# Patient Record
Sex: Female | Born: 1935 | Race: Black or African American | Hispanic: No | Smoking: Never smoker
Health system: Southern US, Community
[De-identification: ages and names within clinical notes are randomized; demographics above are authoritative.]

## PROBLEM LIST (undated history)

## (undated) DIAGNOSIS — F0280 Dementia in other diseases classified elsewhere without behavioral disturbance: Secondary | ICD-10-CM

## (undated) DIAGNOSIS — G309 Alzheimer's disease, unspecified: Secondary | ICD-10-CM

## (undated) DIAGNOSIS — I219 Acute myocardial infarction, unspecified: Secondary | ICD-10-CM

## (undated) DIAGNOSIS — D219 Benign neoplasm of connective and other soft tissue, unspecified: Secondary | ICD-10-CM

## (undated) DIAGNOSIS — N200 Calculus of kidney: Secondary | ICD-10-CM

## (undated) HISTORY — PX: HEMORRHOID SURGERY: SHX153

## (undated) HISTORY — PX: RECTAL PROLAPSE EXCISION: SUR549

---

## 2008-09-10 ENCOUNTER — Emergency Department (HOSPITAL_BASED_OUTPATIENT_CLINIC_OR_DEPARTMENT_OTHER): Admission: EM | Admit: 2008-09-10 | Discharge: 2008-09-10 | Payer: Self-pay | Admitting: Emergency Medicine

## 2008-09-10 ENCOUNTER — Ambulatory Visit: Payer: Self-pay | Admitting: Radiology

## 2009-08-05 ENCOUNTER — Ambulatory Visit: Payer: Self-pay | Admitting: Diagnostic Radiology

## 2009-08-05 ENCOUNTER — Emergency Department (HOSPITAL_BASED_OUTPATIENT_CLINIC_OR_DEPARTMENT_OTHER): Admission: EM | Admit: 2009-08-05 | Discharge: 2009-08-05 | Payer: Self-pay | Admitting: Emergency Medicine

## 2009-12-31 IMAGING — CT CT HEAD W/O CM
1 of 3 series · 15 of 30 positions shown, 19 images · non-contrast
Comparison: None available.

CT HEAD

CLINICAL DATA: Status post fall.  Laceration over right eye.
History of dementia.

CT HEAD WITHOUT CONTRAST
CT MAXILLOFACIAL WITHOUT CONTRAST
TECHNIQUE: Multidetector CT imaging of the head and maxillofacial
structures were performed using the standard protocol without
intravenous contrast. Multiplanar CT image reconstructions of the
maxillofacial structures were also generated.

[Series 3: head 4.8 h37s · axial · 0.46mm/px · z∈[-146,-0]mm · 15 of 34 slices shown, 19 images]
[im 2/34  brain]
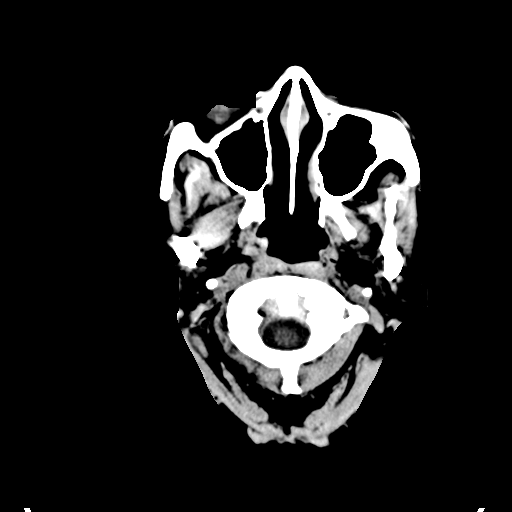
[im 2/34  bone]
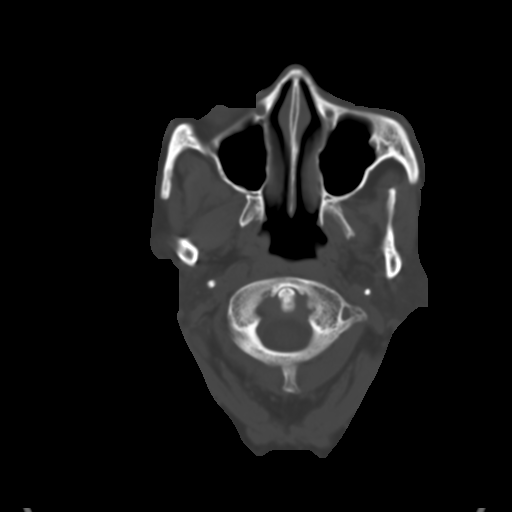
[im 5/34  brain]
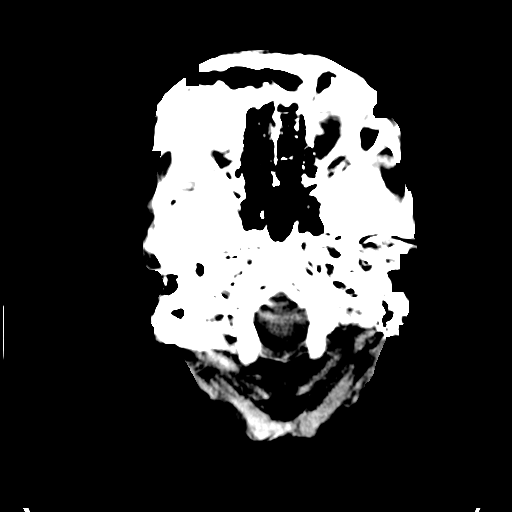
[im 7/34  brain]
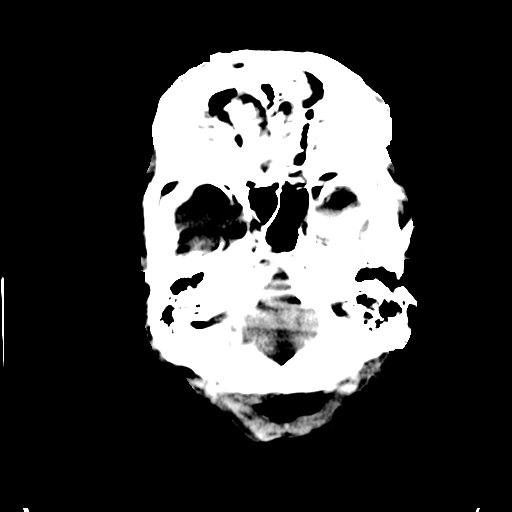
[im 8/34  brain]
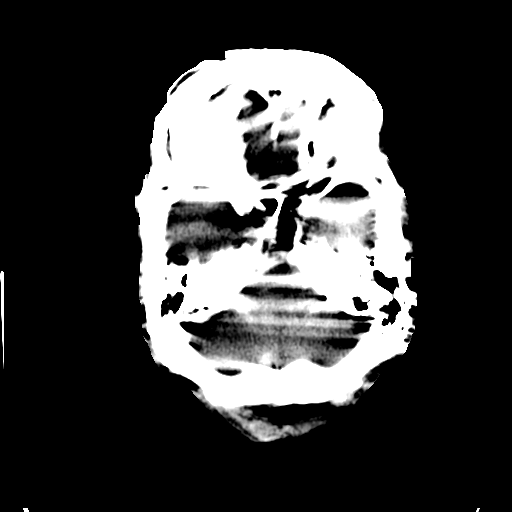
[im 12/34  brain]
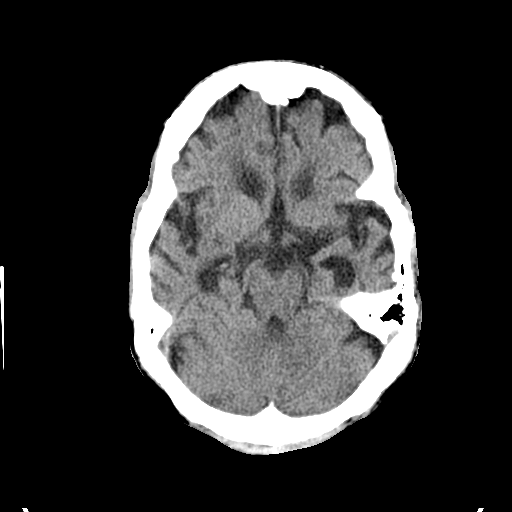
[im 12/34  bone]
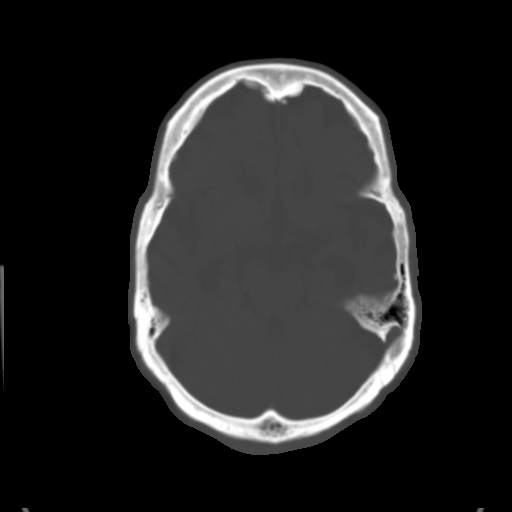
[im 13/34  brain]
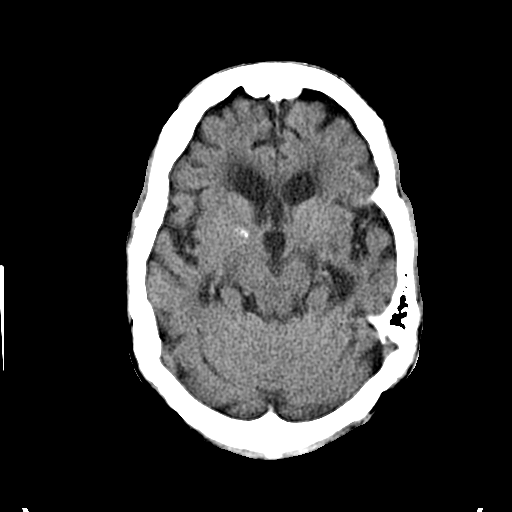
[im 15/34  brain]
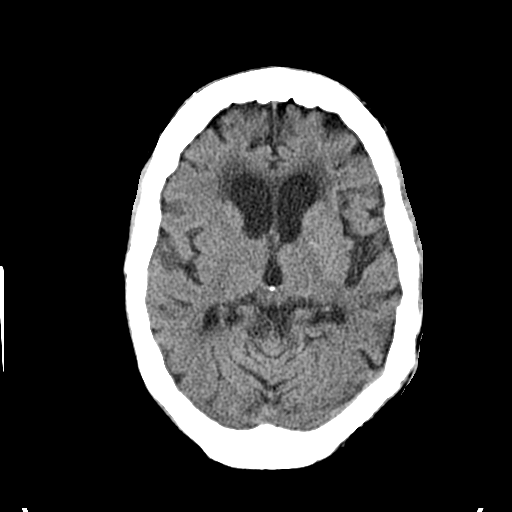
[im 18/34  brain]
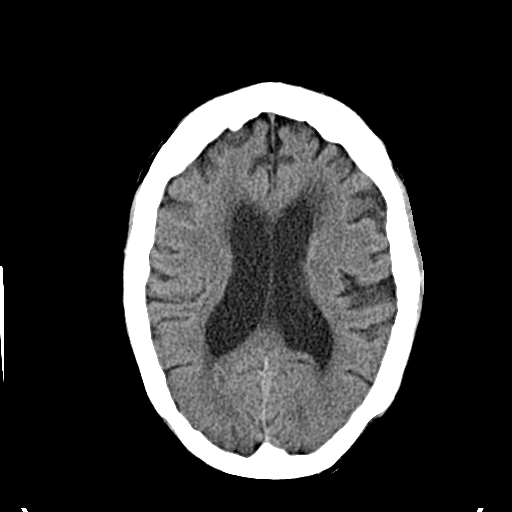
[im 19/34  brain]
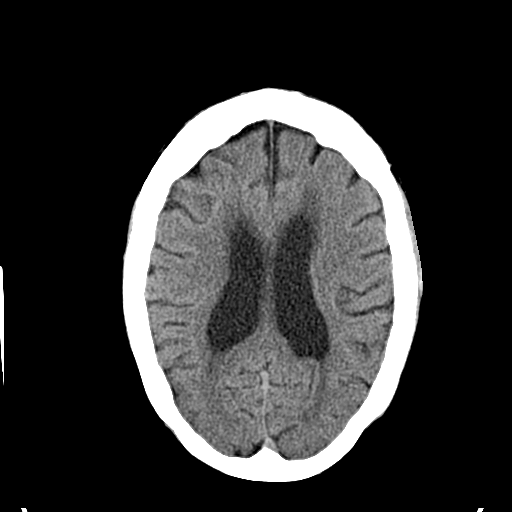
[im 19/34  bone]
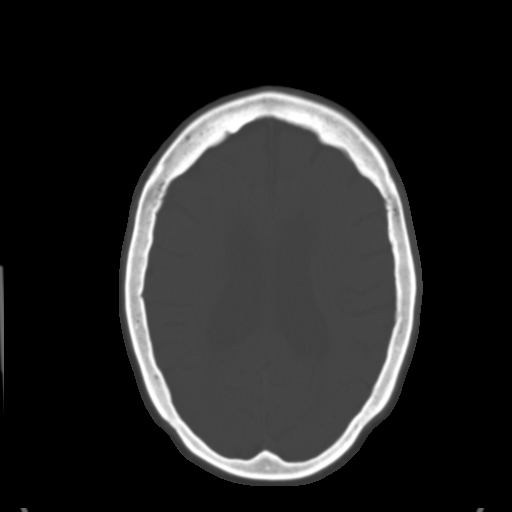
[im 21/34  brain]
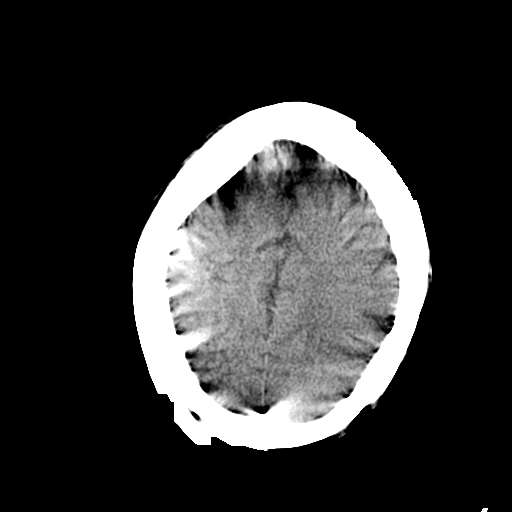
[im 24/34  brain]
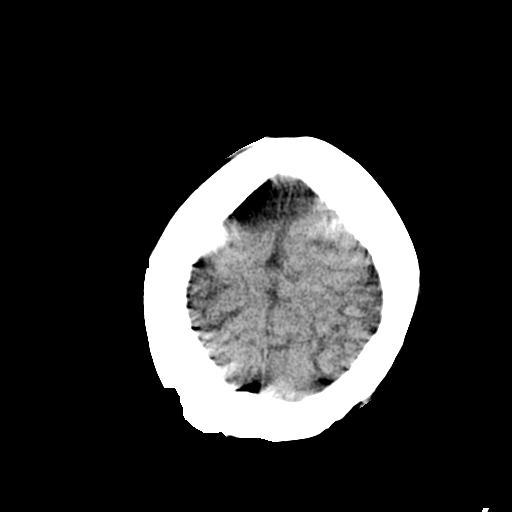
[im 26/34  brain]
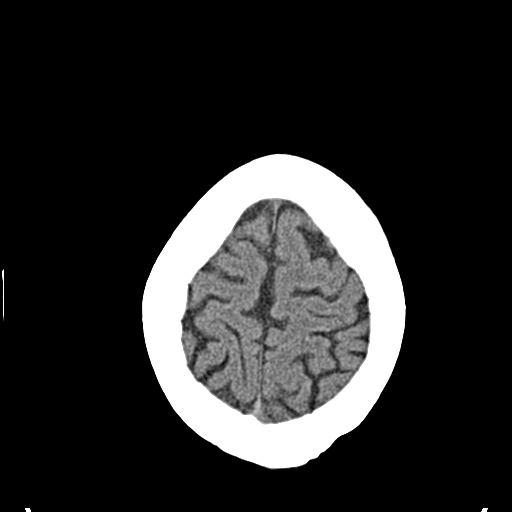
[im 27/34  brain]
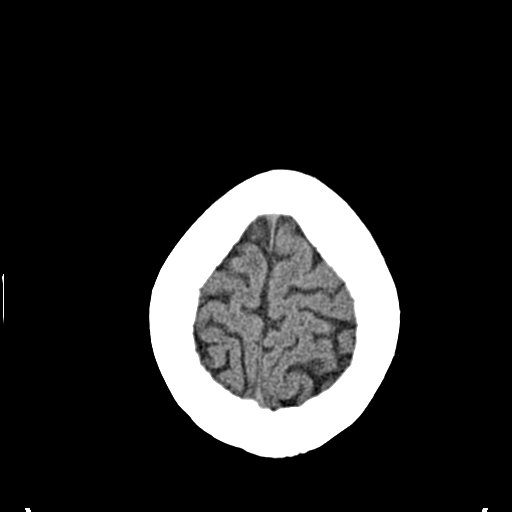
[im 27/34  bone]
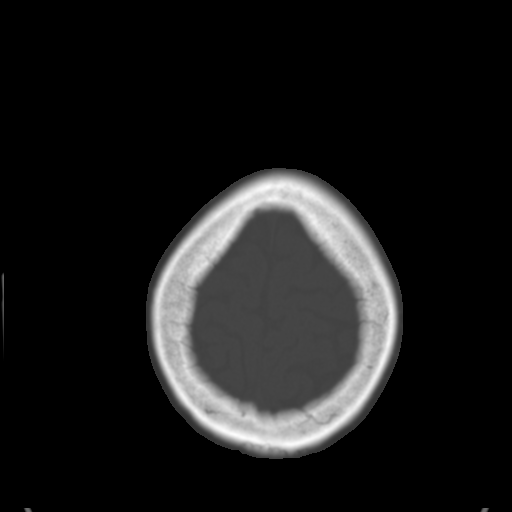
[im 30/34  brain]
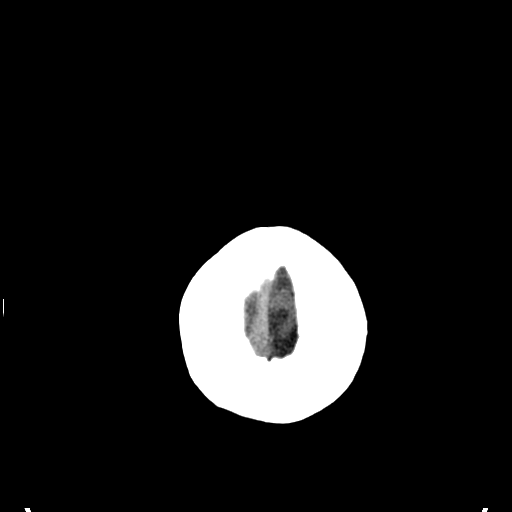
[im 32/34  brain]
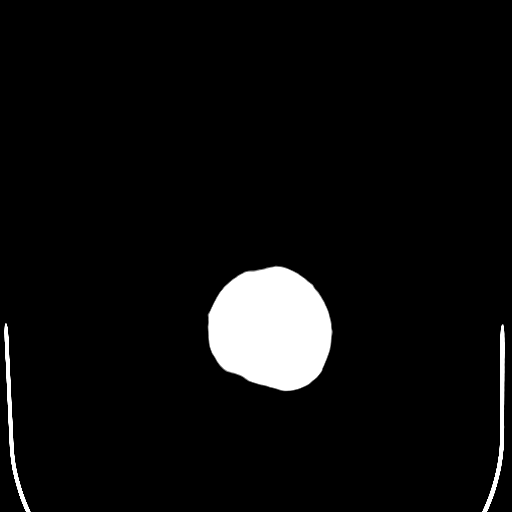

[15 of 30 positions shown; findings below may reference images not displayed]

FINDINGS: The study is somewhat degraded by patient motion.
Multiple areas were imaged a second time for better coverage.  No
acute intracranial abnormality is present.  Specifically, there is
no evidence for acute infarct, hemorrhage, mass, hydrocephalus, or
extra-axial fluid collection.  Mild atrophy is present.
Periventricular white matter hypoattenuation is noted bilaterally.

The paranasal sinuses and mastoid air cells are clear.  The osseous
skull is intact.
IMPRESSION: 1.  No acute intracranial abnormality.
2.  Moderate atrophy and periventricular white matter
hypoattenuation.  This is nonspecific, but likely reflects the
sequelae of chronic microvascular ischemia.
3.  No evidence for acute trauma.

CT MAXILLOFACIAL
FINDINGS: The facial CT is also somewhat degraded by patient
motion.  There is mild soft tissue swelling about the right orbit
without underlying fracture.  The sinuses are clear.  Nasal bones
are intact.  The mandible is intact and located.  There is no
evidence for acute fracture.
IMPRESSION: 1.  Minimal infraorbital and lateral periorbital soft tissue
swelling about the right orbit without underlying fracture or
significant sinus disease.
2.  No other focal evidence for trauma.

## 2010-11-25 IMAGING — CR DG CHEST 2V
2 series · 2 of 2 positions shown · non-contrast
Comparison: None

CLINICAL DATA: Syncope, weakness and dementia.

CHEST - 2 VIEW

[w chest pa]
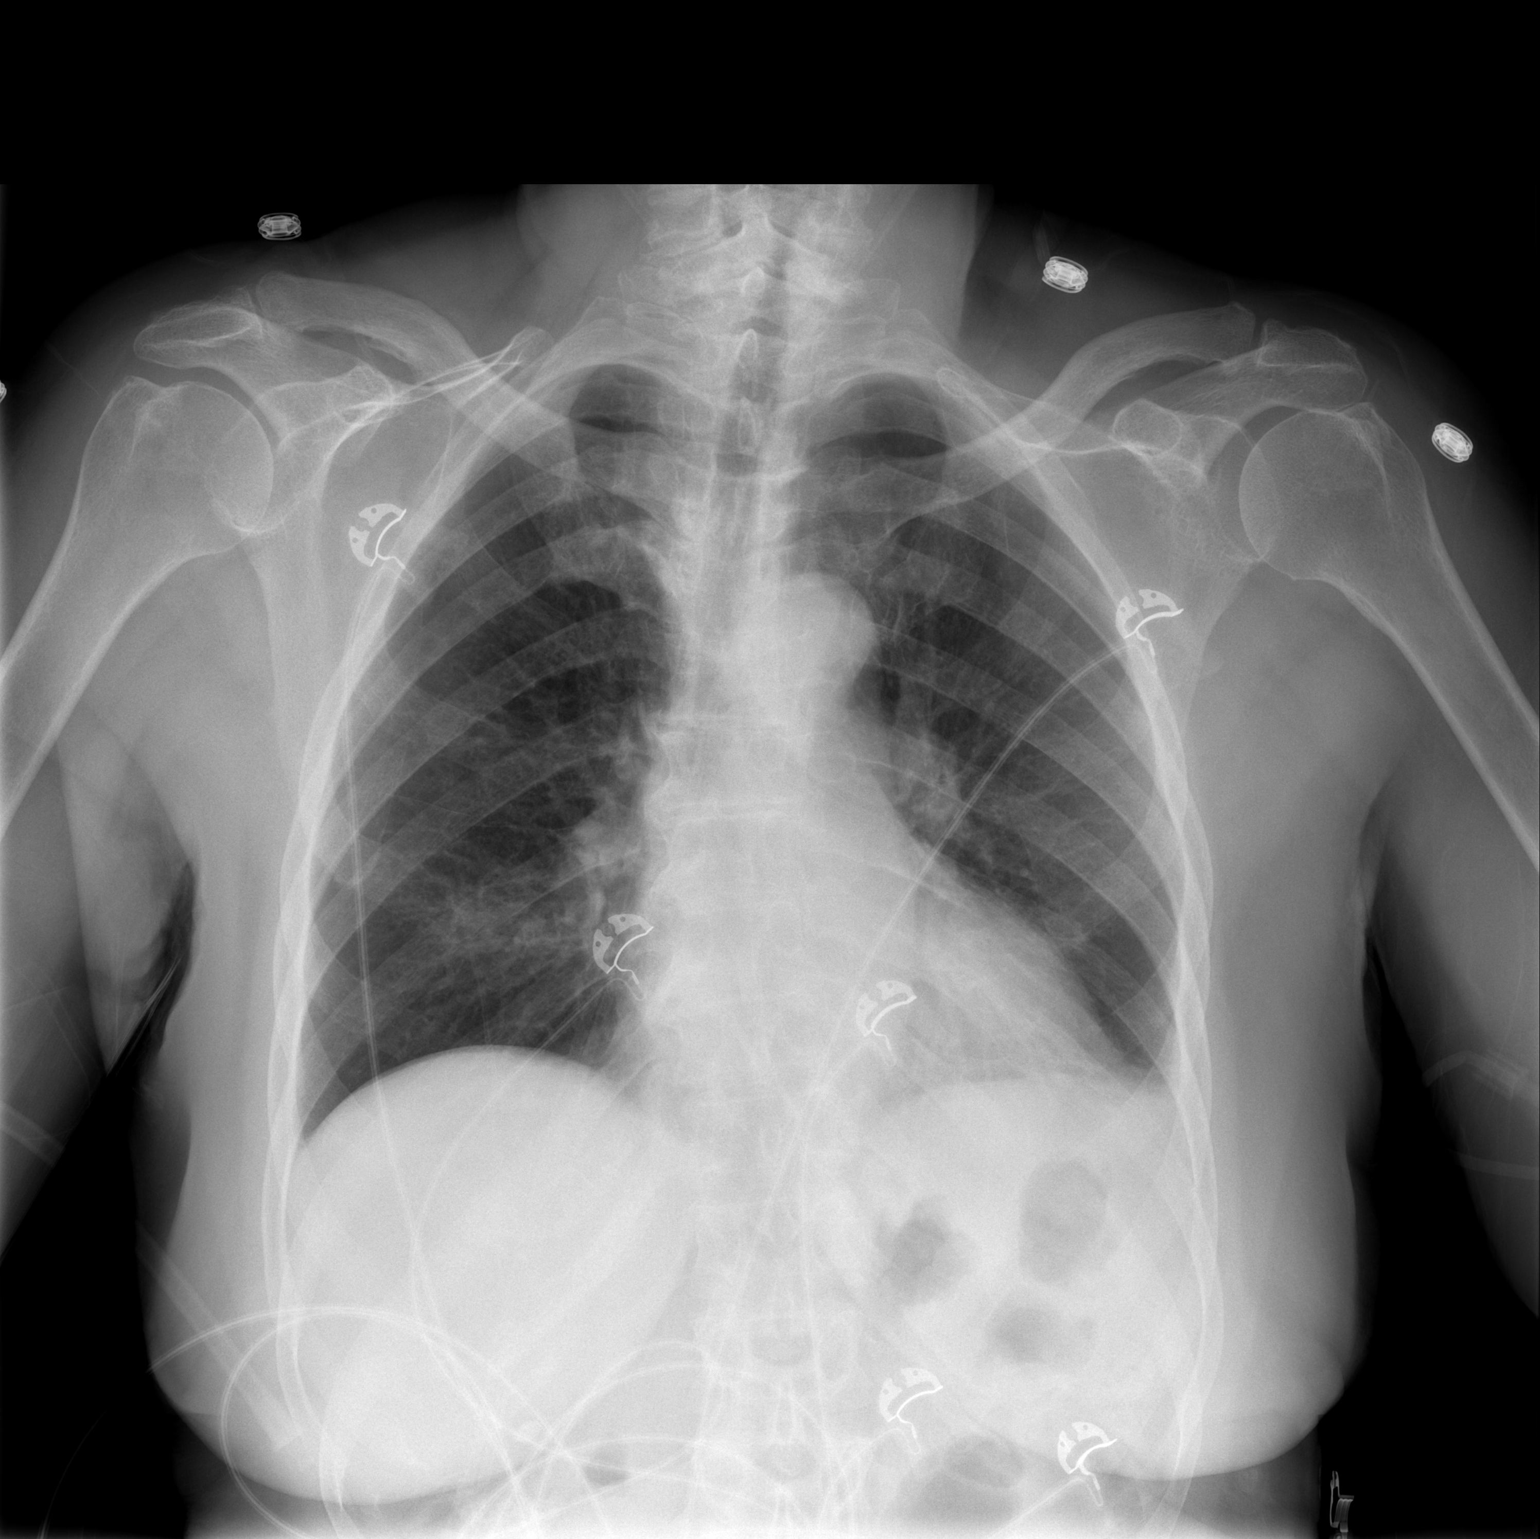

[w chest lat]
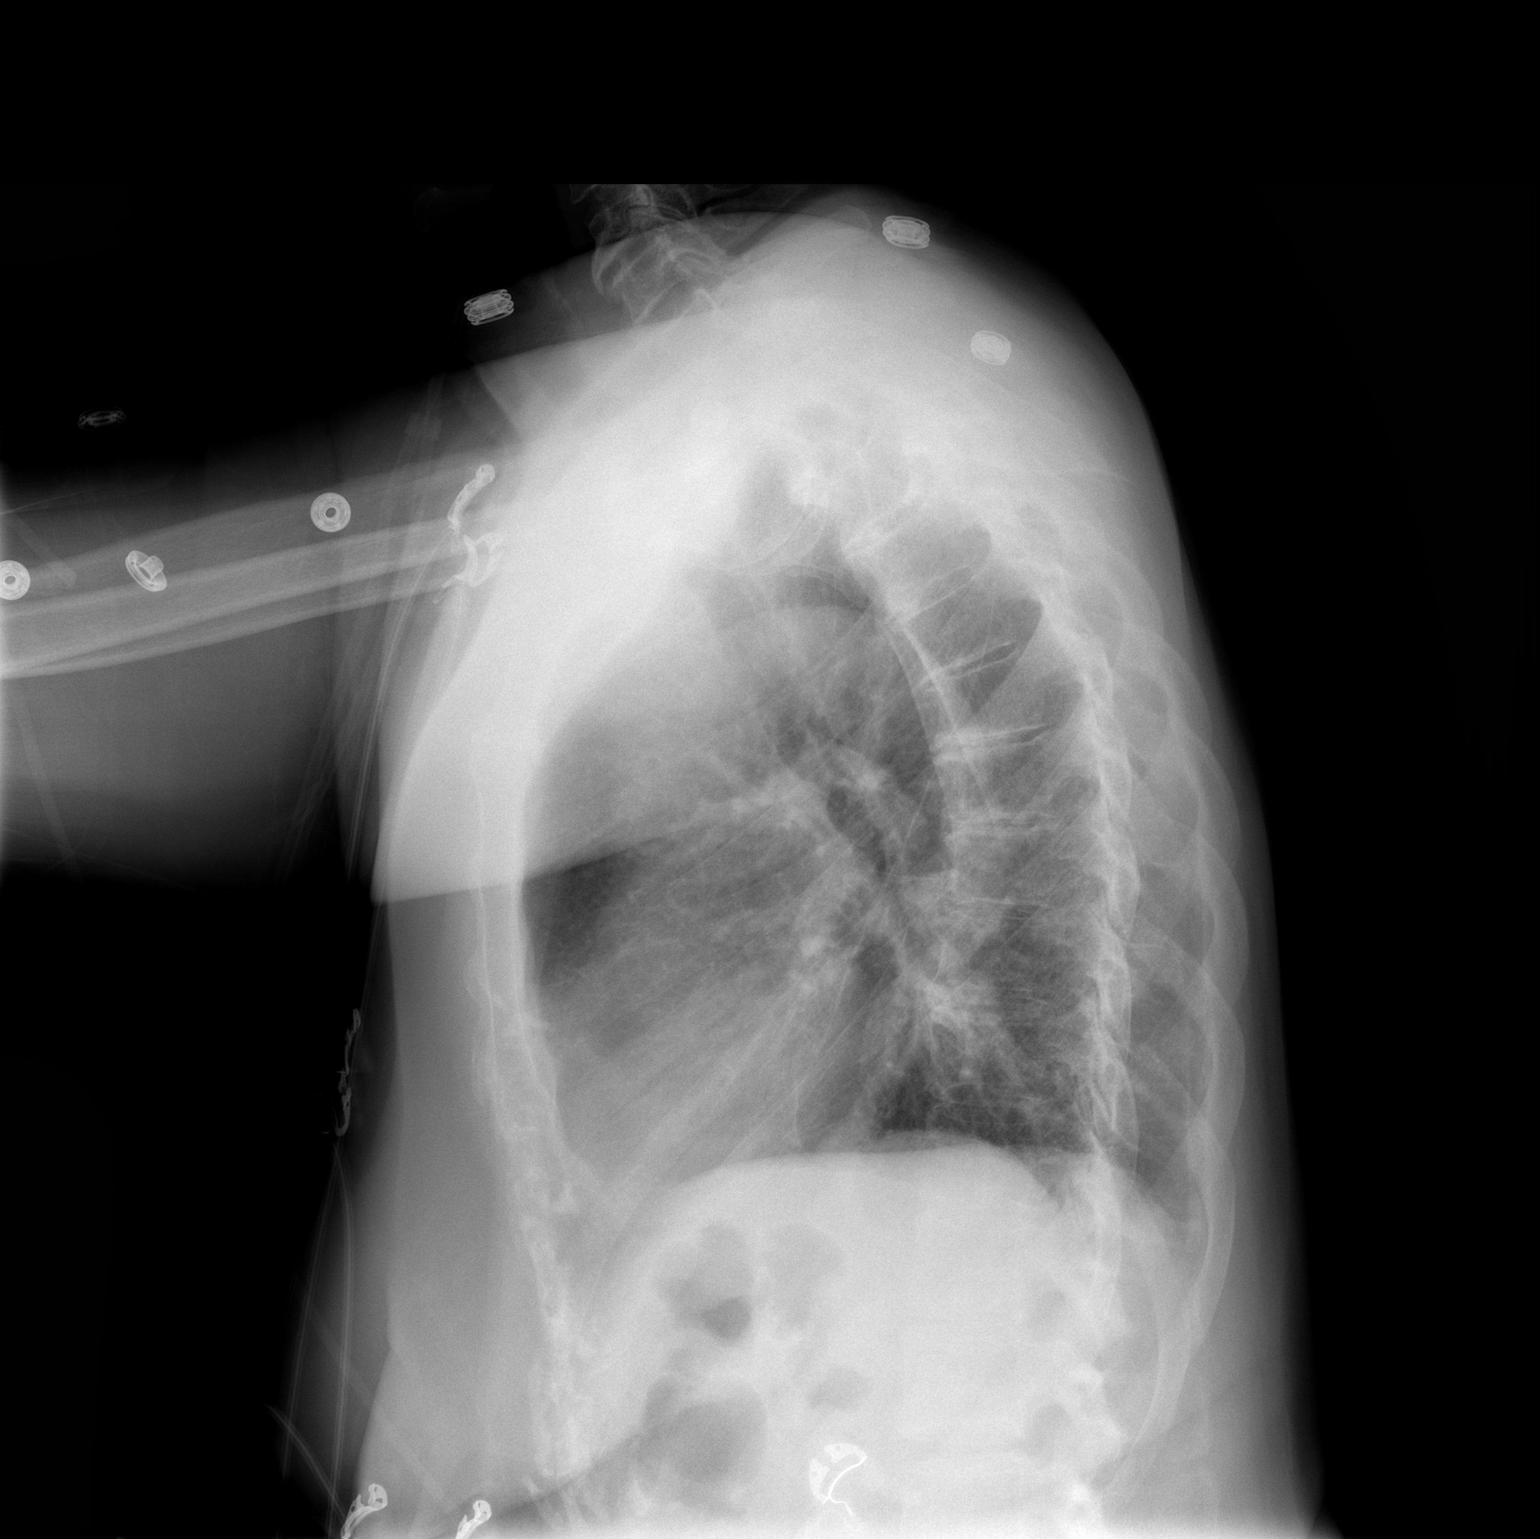

[2 of 2 positions shown; findings below may reference images not displayed]

FINDINGS: The cardiac silhouette, mediastinal and hilar contours
are within normal limits.  There are chronic-appearing bronchitic
type interstitial lung changes.  No definite infiltrates, edema or
effusions.  The bony thorax is intact.
IMPRESSION: Chronic-appearing bronchitic type lung changes without definite
acute overlying pulmonary process.

## 2010-12-11 LAB — URINALYSIS, ROUTINE W REFLEX MICROSCOPIC
Bilirubin Urine: NEGATIVE
Hgb urine dipstick: NEGATIVE
Ketones, ur: NEGATIVE mg/dL
Nitrite: POSITIVE — AB
Specific Gravity, Urine: 1.015 (ref 1.005–1.030)
pH: 7 (ref 5.0–8.0)

## 2010-12-11 LAB — BASIC METABOLIC PANEL
CO2: 28 mEq/L (ref 19–32)
GFR calc Af Amer: 57 mL/min — ABNORMAL LOW (ref >60)
Glucose, Bld: 109 mg/dL — ABNORMAL HIGH (ref 70–99)
Potassium: 4.4 mEq/L (ref 3.5–5.1)
Sodium: 145 mEq/L (ref 135–145)

## 2010-12-11 LAB — POCT CARDIAC MARKERS
CKMB, poc: 1.1 ng/mL (ref 1.0–8.0)
Myoglobin, poc: 123 ng/mL (ref 12–200)

## 2010-12-11 LAB — DIFFERENTIAL
Basophils Absolute: 0.3 10*3/uL — ABNORMAL HIGH (ref 0.0–0.1)
Eosinophils Relative: 1 % (ref 0–5)
Lymphocytes Relative: 13 % (ref 12–46)
Monocytes Absolute: 0.5 10*3/uL (ref 0.1–1.0)
Monocytes Relative: 5 % (ref 3–12)
Neutro Abs: 8.2 10*3/uL — ABNORMAL HIGH (ref 1.7–7.7)

## 2010-12-11 LAB — URINE MICROSCOPIC-ADD ON

## 2010-12-11 LAB — CBC
HCT: 36.4 % (ref 36.0–46.0)
Hemoglobin: 11.5 g/dL — ABNORMAL LOW (ref 12.0–15.0)
MCHC: 31.6 g/dL (ref 30.0–36.0)
RBC: 5 MIL/uL (ref 3.87–5.11)
RDW: 14.1 % (ref 11.5–15.5)

## 2013-02-18 ENCOUNTER — Ambulatory Visit (INDEPENDENT_AMBULATORY_CARE_PROVIDER_SITE_OTHER): Payer: BLUE CROSS/BLUE SHIELD | Admitting: Podiatry

## 2013-02-18 ENCOUNTER — Encounter: Payer: Self-pay | Admitting: Podiatry

## 2013-02-18 VITALS — BP 138/69 | HR 67 | Wt 168.0 lb

## 2013-02-18 DIAGNOSIS — L600 Ingrowing nail: Secondary | ICD-10-CM | POA: Insufficient documentation

## 2013-02-18 DIAGNOSIS — M79609 Pain in unspecified limb: Secondary | ICD-10-CM

## 2013-02-18 DIAGNOSIS — M79674 Pain in right toe(s): Secondary | ICD-10-CM | POA: Insufficient documentation

## 2013-02-18 NOTE — Progress Notes (Signed)
Subjective: 79 y.o. year old female patient presents accompanied by her daughter to have nails trimmed. The right great toe nails gets ingrown and causes pain.   Objective: Dermatologic: Thick yellow deformed nails ingrown on right great toe medial border.  Vascular: Pedal pulses are not palpable. Orthopedic: Contracted lesser digits.  Neurologic: Unable to determine. Patient cannot respond to question.   Assessment: Dystrophic mycotic nails x 10. Ingrown nail right great toe.  Treatment: All mycotic nails debrided.  Return in 3 months or as needed.

## 2013-05-13 ENCOUNTER — Ambulatory Visit (INDEPENDENT_AMBULATORY_CARE_PROVIDER_SITE_OTHER): Payer: BLUE CROSS/BLUE SHIELD | Admitting: Podiatry

## 2013-05-13 DIAGNOSIS — B351 Tinea unguium: Secondary | ICD-10-CM

## 2013-05-13 DIAGNOSIS — M79609 Pain in unspecified limb: Secondary | ICD-10-CM

## 2013-05-13 DIAGNOSIS — M79674 Pain in right toe(s): Secondary | ICD-10-CM

## 2013-05-13 DIAGNOSIS — L600 Ingrowing nail: Secondary | ICD-10-CM

## 2013-05-13 NOTE — Progress Notes (Signed)
Subjective:  79 y.o. year old female patient presents via wheel chair accompanied by her daughter to have nails trimmed. The right great toe nails gets ingrown and causes pain.   Objective: Dermatologic: Thick yellow deformed nails ingrown on right great toe medial border.  Vascular: Pedal pulses are not palpable.  Orthopedic: Contracted lesser digits.  Neurologic: Unable to determine. Patient cannot respond to question.   Assessment:  Dystrophic mycotic nails x 10.  Ingrown nail right great toe.   Treatment: All mycotic nails debrided.  Return in 3 months or as needed.

## 2013-05-20 ENCOUNTER — Ambulatory Visit: Payer: BLUE CROSS/BLUE SHIELD | Admitting: Podiatry

## 2013-06-29 ENCOUNTER — Emergency Department (HOSPITAL_BASED_OUTPATIENT_CLINIC_OR_DEPARTMENT_OTHER)
Admission: EM | Admit: 2013-06-29 | Discharge: 2013-06-29 | Disposition: A | Discharge status: Home / Self Care | Payer: Medicare Other | Attending: Emergency Medicine | Admitting: Emergency Medicine

## 2013-06-29 ENCOUNTER — Encounter (HOSPITAL_BASED_OUTPATIENT_CLINIC_OR_DEPARTMENT_OTHER): Payer: Self-pay | Admitting: Emergency Medicine

## 2013-06-29 DIAGNOSIS — I252 Old myocardial infarction: Secondary | ICD-10-CM | POA: Insufficient documentation

## 2013-06-29 DIAGNOSIS — Z8742 Personal history of other diseases of the female genital tract: Secondary | ICD-10-CM | POA: Insufficient documentation

## 2013-06-29 DIAGNOSIS — N898 Other specified noninflammatory disorders of vagina: Secondary | ICD-10-CM | POA: Insufficient documentation

## 2013-06-29 DIAGNOSIS — Z87442 Personal history of urinary calculi: Secondary | ICD-10-CM | POA: Insufficient documentation

## 2013-06-29 DIAGNOSIS — N939 Abnormal uterine and vaginal bleeding, unspecified: Secondary | ICD-10-CM

## 2013-06-29 DIAGNOSIS — F0280 Dementia in other diseases classified elsewhere without behavioral disturbance: Secondary | ICD-10-CM | POA: Insufficient documentation

## 2013-06-29 DIAGNOSIS — Z7982 Long term (current) use of aspirin: Secondary | ICD-10-CM | POA: Insufficient documentation

## 2013-06-29 DIAGNOSIS — G309 Alzheimer's disease, unspecified: Secondary | ICD-10-CM | POA: Insufficient documentation

## 2013-06-29 DIAGNOSIS — Z79899 Other long term (current) drug therapy: Secondary | ICD-10-CM | POA: Insufficient documentation

## 2013-06-29 HISTORY — DX: Benign neoplasm of connective and other soft tissue, unspecified: D21.9

## 2013-06-29 HISTORY — DX: Acute myocardial infarction, unspecified: I21.9

## 2013-06-29 HISTORY — DX: Dementia in other diseases classified elsewhere, unspecified severity, without behavioral disturbance, psychotic disturbance, mood disturbance, and anxiety: F02.80

## 2013-06-29 HISTORY — DX: Alzheimer's disease, unspecified: G30.9

## 2013-06-29 HISTORY — DX: Calculus of kidney: N20.0

## 2013-06-29 LAB — WET PREP, GENITAL: Clue Cells Wet Prep HPF POC: NONE SEEN

## 2013-06-29 LAB — URINE MICROSCOPIC-ADD ON

## 2013-06-29 LAB — BASIC METABOLIC PANEL
CO2: 30 mEq/L (ref 19–32)
Calcium: 9.7 mg/dL (ref 8.4–10.5)
Creatinine, Ser: 0.9 mg/dL (ref 0.50–1.10)
GFR calc Af Amer: 62 mL/min — ABNORMAL LOW (ref >90)
GFR calc non Af Amer: 54 mL/min — ABNORMAL LOW (ref >90)
Sodium: 140 mEq/L (ref 135–145)

## 2013-06-29 LAB — URINALYSIS, ROUTINE W REFLEX MICROSCOPIC
Glucose, UA: NEGATIVE mg/dL
Ketones, ur: NEGATIVE mg/dL
Protein, ur: NEGATIVE mg/dL
Urobilinogen, UA: 0.2 mg/dL (ref 0.0–1.0)

## 2013-06-29 LAB — CBC
MCH: 22.6 pg — ABNORMAL LOW (ref 26.0–34.0)
MCHC: 32 g/dL (ref 30.0–36.0)
MCV: 70.8 fL — ABNORMAL LOW (ref 78.0–100.0)
Platelets: 140 10*3/uL — ABNORMAL LOW (ref 150–400)
RDW: 14.6 % (ref 11.5–15.5)

## 2013-06-29 NOTE — ED Notes (Signed)
Per daughter pt started vaginal bleeding started yesterday-wearing own urinary pads

## 2013-06-29 NOTE — ED Provider Notes (Signed)
CSN: [id number]     Arrival date & time 06/29/13  1937 History  This chart was scribed for Dagmar Hait, MD by Caryn Bee, ED Scribe. This patient was seen in room MH01/MH01 and the patient's care was started 9:10 PM.    Chief Complaint  Patient presents with  . Vaginal Bleeding    Patient is a 79 y.o. female presenting with vaginal bleeding. The history is provided by a relative. No language interpreter was used.  Vaginal Bleeding Quality:  Unable to specify Severity:  Mild Onset quality:  Sudden Duration:  1 day Timing:  Rare Progression:  Partially resolved Chronicity:  New Menstrual history:  Postmenopausal Possible pregnancy: no   Relieved by:  None tried Worsened by:  Nothing tried Ineffective treatments:  None tried  HPI Comments: Jacqueline Daniel is a 79 y.o. female who presents to the Emergency Department complaining of vaginal bleeding that began yesterday evening. Pt had the same symptoms one year ago and was diagnosed with a fibroid. Last time the bleed lasted about 2 days. She reports h/o kidney stones. Pt has slight dementia at baseline. Pt regularly takes 80 mg baby aspirin daily. Pt's relative denies rectal bleed or discolored urine. Pt's family denies any change in personality recently. Family believes that she may have a rectal prolapse, but it has not been diagnosed. Pt's PCP is Dr. Kathrynn Speed.   Past Medical History  Diagnosis Date  . Alzheimer's dementia   . Myocardial infarction   . Fibroid   . Kidney stone    Past Surgical History  Procedure Laterality Date  . Rectal prolapse excision    . Hemorrhoid surgery     No family history on file. History  Substance Use Topics  . Smoking status: Never Smoker   . Smokeless tobacco: Not on file  . Alcohol Use: No   OB History   Grav Para Term Preterm Abortions TAB SAB Ect Mult Living                 Review of Systems  Genitourinary: Positive for vaginal bleeding.  All other systems reviewed and  are negative.    Allergies  Review of patient's allergies indicates no known allergies.  Home Medications   Current Outpatient Rx  Name  Route  Sig  Dispense  Refill  . aspirin EC 81 MG tablet   Oral   Take 81 mg by mouth daily.         . calcium carbonate (OS-CAL) 600 MG TABS   Oral   Take 600 mg by mouth 2 (two) times daily with a meal.         . cephALEXin (KEFLEX) 250 MG capsule   Oral   Take 250 mg by mouth every morning.         . Cholecalciferol (VITAMIN D-3) 5000 UNITS TABS   Oral   Take by mouth daily.         Marland Kitchen donepezil (ARICEPT) 10 MG tablet   Oral   Take 10 mg by mouth at bedtime as needed.         . memantine (NAMENDA) 10 MG tablet   Oral   Take 10 mg by mouth 2 (two) times daily.         . Multiple Vitamin (MULTIVITAMIN) tablet   Oral   Take 1 tablet by mouth daily.         . polyethylene glycol (MIRALAX / GLYCOLAX) packet   Oral   Take 17 g  by mouth daily.         . simvastatin (ZOCOR) 80 MG tablet   Oral   Take 80 mg by mouth at bedtime.         . vitamin B-12 (CYANOCOBALAMIN) 500 MCG tablet   Oral   Take 500 mcg by mouth daily.          BP 122/79  Pulse 102  Temp(Src) 98.6 F (37 C) (Rectal)  Resp 20  Ht 5\' 2"  (1.575 m)  Wt 160 lb (72.576 kg)  BMI 29.26 kg/m2  SpO2 %  Physical Exam  Nursing note and vitals reviewed. Constitutional: She is oriented to person, place, and time. She appears well-developed and well-nourished. No distress.  HENT:  Head: Normocephalic and atraumatic.  Eyes: EOM are normal.  Neck: Neck supple. No tracheal deviation present.  Cardiovascular: Normal rate and regular rhythm.  Exam reveals no gallop and no friction rub.   No murmur heard. Pulmonary/Chest: Effort normal and breath sounds normal. No respiratory distress. She has no wheezes. She has no rales. She exhibits no tenderness.  Genitourinary: Rectal exam shows anal tone normal. Guaiac negative stool. There is no rash, tenderness  or lesion on the right labia. There is no rash, tenderness or lesion on the left labia. Cervix exhibits no motion tenderness, no discharge and no friability. Right adnexum displays no mass, no tenderness and no fullness. Left adnexum displays no mass, no tenderness and no fullness. There is bleeding (clots, no active bleeding) around the vagina. No tenderness around the vagina. No foreign body around the vagina. No signs of injury around the vagina. No vaginal discharge found.  Musculoskeletal: Normal range of motion.  Neurological: She is alert and oriented to person, place, and time.  Skin: Skin is warm and dry.  Psychiatric: She has a normal mood and affect. Her behavior is normal.    ED Course  Procedures (including critical care time) DIAGNOSTIC STUDIES:  COORDINATION OF CARE: 9:12 PM-Discussed treatment plan with pt at bedside and pt agreed to plan.   Labs Review Labs Reviewed  WET PREP, GENITAL - Abnormal; Notable for the following:    WBC, Wet Prep HPF POC FEW (*)    All other components within normal limits  CBC - Abnormal; Notable for the following:    Hemoglobin 11.0 (*)    HCT 34.4 (*)    MCV 70.8 (*)    MCH 22.6 (*)    Platelets 140 (*)    All other components within normal limits  BASIC METABOLIC PANEL - Abnormal; Notable for the following:    Potassium 3.4 (*)    GFR calc non Af Amer 54 (*)    GFR calc Af Amer 62 (*)    All other components within normal limits  URINALYSIS, ROUTINE W REFLEX MICROSCOPIC - Abnormal; Notable for the following:    Hgb urine dipstick MODERATE (*)    Leukocytes, UA TRACE (*)    All other components within normal limits  OCCULT BLOOD X 1 CARD TO LAB, STOOL  URINE MICROSCOPIC-ADD ON   Imaging Review No results found.  EKG Interpretation   None       MDM   1. Vaginal bleeding    79 year old female presents with vaginal bleeding. She's had vaginal bleeding once before last year, was diagnosed with a fibroid at that time. Family  is unsure if the bleeding is urinary, rectal, or vaginal as a source. Patient is at her baseline mentally with dementia. She is  not acting abnormal. Here vitals are stable. On exam, patient has mild amount of blood in the vaginal vault. It is coming from the cervix. There is no active bleeding. She has a normal rectal exam which is Hemoccult negative. Her urine had blood, however it is likely secondary to her bleeding.  Labs normal. Instructed family to f/u with Gynecologist. Family comfortable with this plan.   I personally performed the services described in this documentation, which was scribed in my presence. The recorded information has been reviewed and is accurate.     Dagmar Hait, MD 06/29/13 [phone]

## 2013-08-12 ENCOUNTER — Ambulatory Visit: Payer: BLUE CROSS/BLUE SHIELD | Admitting: Podiatry

## 2013-08-19 ENCOUNTER — Ambulatory Visit: Payer: BLUE CROSS/BLUE SHIELD | Admitting: Podiatry

## 2013-08-22 ENCOUNTER — Encounter: Payer: Self-pay | Admitting: Podiatry

## 2013-08-22 ENCOUNTER — Ambulatory Visit (INDEPENDENT_AMBULATORY_CARE_PROVIDER_SITE_OTHER): Payer: BLUE CROSS/BLUE SHIELD | Admitting: Podiatry

## 2013-08-22 VITALS — BP 130/70 | HR 67 | Ht 62.0 in | Wt 157.0 lb

## 2013-08-22 DIAGNOSIS — M79674 Pain in right toe(s): Secondary | ICD-10-CM

## 2013-08-22 DIAGNOSIS — L600 Ingrowing nail: Secondary | ICD-10-CM

## 2013-08-22 DIAGNOSIS — B351 Tinea unguium: Secondary | ICD-10-CM

## 2013-08-22 DIAGNOSIS — M79609 Pain in unspecified limb: Secondary | ICD-10-CM

## 2013-08-22 NOTE — Progress Notes (Signed)
Subjective:  79 y.o. year old female patient presents via wheel chair accompanied by her daughter to have nails trimmed.   Objective: Dermatologic: Thick yellow deformed nails ingrown on right great toe medial border.  Vascular: Both feet forefoot area temperature is cold with purple red coloration. Pedal pulses are not palpable.  Orthopedic: Contracted lesser digits.  Neurologic: Unable to determine. Patient cannot respond to question.   Assessment:  Dystrophic mycotic nails x 10.  Ingrown nails both great toes.   Treatment: All mycotic nails debrided.  Return in 3 months or as needed.

## 2013-08-22 NOTE — Patient Instructions (Signed)
Seen for hypertrophic nails. No new changes seen. All nails debrided. Return in 3 month.

## 2013-11-21 ENCOUNTER — Encounter: Payer: Self-pay | Admitting: Podiatry

## 2013-11-21 ENCOUNTER — Ambulatory Visit (INDEPENDENT_AMBULATORY_CARE_PROVIDER_SITE_OTHER): Payer: BLUE CROSS/BLUE SHIELD | Admitting: Podiatry

## 2013-11-21 VITALS — BP 119/65 | HR 69

## 2013-11-21 DIAGNOSIS — M79606 Pain in leg, unspecified: Secondary | ICD-10-CM | POA: Insufficient documentation

## 2013-11-21 DIAGNOSIS — M79609 Pain in unspecified limb: Secondary | ICD-10-CM

## 2013-11-21 DIAGNOSIS — L600 Ingrowing nail: Secondary | ICD-10-CM

## 2013-11-21 DIAGNOSIS — B351 Tinea unguium: Secondary | ICD-10-CM

## 2013-11-21 NOTE — Patient Instructions (Signed)
Seen for hypertrophic and ingrown nails. All nails debrided. Return in 3 months or as needed.  

## 2013-11-21 NOTE — Progress Notes (Signed)
Subjective:  80 y.o. year old female patient presents via wheel chair accompanied by her daughter to have nails trimmed.   Objective: Dermatologic: Thick yellow deformed nails ingrown on right and left great toe medial border.  Vascular: Pedal pulses are not palpable. No edema or erythema noted. Orthopedic: Contracted lesser digits.  Neurologic: Unable to determine. Patient cannot respond to question.   Assessment:  Dystrophic mycotic nails x 10.  Ingrown nails both great toes.   Treatment: All mycotic nails debrided.  Return in 3 months or as needed.

## 2014-02-21 ENCOUNTER — Encounter: Payer: Self-pay | Admitting: Podiatry

## 2014-02-21 ENCOUNTER — Ambulatory Visit (INDEPENDENT_AMBULATORY_CARE_PROVIDER_SITE_OTHER): Payer: BLUE CROSS/BLUE SHIELD | Admitting: Podiatry

## 2014-02-21 DIAGNOSIS — M79609 Pain in unspecified limb: Secondary | ICD-10-CM

## 2014-02-21 DIAGNOSIS — L600 Ingrowing nail: Secondary | ICD-10-CM

## 2014-02-21 DIAGNOSIS — M79606 Pain in leg, unspecified: Secondary | ICD-10-CM

## 2014-02-21 DIAGNOSIS — B351 Tinea unguium: Secondary | ICD-10-CM

## 2014-02-21 NOTE — Patient Instructions (Signed)
Seen for hypertrophic nails. All nails debrided. Return in 3 months or as needed.  

## 2014-02-21 NOTE — Progress Notes (Signed)
Subjective:  80 y.o. year old female patient presents via wheel chair accompanied by her daughter to have nails trimmed.   Objective: No new changes. Dermatologic: Thick yellow deformed nails ingrown on right and left great toe medial border.  Vascular: Pedal pulses are not palpable. No edema or erythema noted.  Orthopedic: Contracted lesser digits bilateral. Neurologic: Unable to determine. Patient cannot respond to question.   Assessment:  Dystrophic mycotic nails x 10.  Ingrown nails both great toes.   Treatment: All mycotic nails debrided.  Return in 3 months or as needed.

## 2014-05-24 ENCOUNTER — Ambulatory Visit (INDEPENDENT_AMBULATORY_CARE_PROVIDER_SITE_OTHER): Payer: BLUE CROSS/BLUE SHIELD | Admitting: Podiatry

## 2014-05-24 ENCOUNTER — Encounter: Payer: Self-pay | Admitting: Podiatry

## 2014-05-24 VITALS — BP 153/71 | HR 67

## 2014-05-24 DIAGNOSIS — L600 Ingrowing nail: Secondary | ICD-10-CM

## 2014-05-24 DIAGNOSIS — B351 Tinea unguium: Secondary | ICD-10-CM

## 2014-05-24 DIAGNOSIS — M79609 Pain in unspecified limb: Secondary | ICD-10-CM

## 2014-05-24 DIAGNOSIS — M79606 Pain in leg, unspecified: Secondary | ICD-10-CM

## 2014-05-24 NOTE — Patient Instructions (Signed)
Seen for hypertrophic nails. All nails debrided. Return in 3 months or as needed.  

## 2014-05-24 NOTE — Progress Notes (Signed)
Subjective:  80 y.o. year old female patient presents via wheel chair accompanied by her daughter to have nails trimmed.  Patient unable to move from wheel chair to treatment chair.   Objective: No new changes. Dermatologic: Thick yellow deformed nails ingrown on right and left great toe medial border.  Vascular: Pedal pulses are not palpable. No edema or erythema noted.  Orthopedic: Contracted lesser digits bilateral.  Neurologic: Unable to determine. Patient cannot respond to question.   Assessment:  Dystrophic mycotic nails x 10.  Ingrown nails both great toes.   Treatment: All mycotic nails debrided.  Return in 3 months or as needed.

## 2014-08-23 ENCOUNTER — Ambulatory Visit: Payer: BLUE CROSS/BLUE SHIELD | Admitting: Podiatry

## 2015-01-31 ENCOUNTER — Encounter: Payer: Self-pay | Admitting: Podiatry

## 2015-01-31 ENCOUNTER — Ambulatory Visit (INDEPENDENT_AMBULATORY_CARE_PROVIDER_SITE_OTHER): Payer: Medicare Other | Admitting: Podiatry

## 2015-01-31 DIAGNOSIS — L600 Ingrowing nail: Secondary | ICD-10-CM | POA: Diagnosis not present

## 2015-01-31 DIAGNOSIS — B351 Tinea unguium: Secondary | ICD-10-CM | POA: Diagnosis not present

## 2015-01-31 NOTE — Patient Instructions (Signed)
Seen for hypertrophic nails. All nails debrided. Return in 3 months or as needed.  

## 2015-01-31 NOTE — Progress Notes (Signed)
Subjective:  81 y.o. year old female patient presents via wheel chair accompanied by her daughter to have nails trimmed.  Patient unable to move from wheel chair to treatment chair.   Objective: No new changes. Dermatologic: Thick yellow deformed nails ingrown on right and left great toe medial border.  Vascular: Pedal pulses are not palpable. No edema or erythema noted.  Orthopedic: Contracted lesser digits bilateral.  Neurologic: Unable to determine. Patient cannot respond to question.   Assessment:  Dystrophic mycotic nails x 10.  Ingrown nails both great toes.   Treatment: All mycotic nails debrided.  Return in 3 months or as needed.

## 2015-05-04 ENCOUNTER — Ambulatory Visit (INDEPENDENT_AMBULATORY_CARE_PROVIDER_SITE_OTHER): Payer: Medicare Other | Admitting: Podiatry

## 2015-05-04 ENCOUNTER — Encounter: Payer: Self-pay | Admitting: Podiatry

## 2015-05-04 DIAGNOSIS — B351 Tinea unguium: Secondary | ICD-10-CM | POA: Diagnosis not present

## 2015-05-04 DIAGNOSIS — M79606 Pain in leg, unspecified: Secondary | ICD-10-CM

## 2015-05-04 DIAGNOSIS — L600 Ingrowing nail: Secondary | ICD-10-CM

## 2015-05-04 NOTE — Progress Notes (Signed)
Subjective:  81 y.o. year old female patient presents via wheel chair accompanied by her daughter to have nails trimmed.  Patient unable to move from wheel chair to treatment chair.   Objective: No new changes. Dermatologic: Thick yellow deformed nails ingrown on right and left great toe medial border.  Vascular: Pedal pulses are not palpable. No edema or erythema noted.  Orthopedic: Contracted lesser digits bilateral.  Neurologic: Unable to determine. Patient cannot respond to question.   Assessment:  Dystrophic mycotic nails x 10.  Ingrown nails both great toes.   Treatment: All mycotic nails debrided.  Return in 3 months or as needed.

## 2015-05-04 NOTE — Patient Instructions (Signed)
Seen for hypertrophic nails and ingrown nails. All nails debrided. Return in 3 months or as needed.  

## 2015-08-07 ENCOUNTER — Ambulatory Visit (INDEPENDENT_AMBULATORY_CARE_PROVIDER_SITE_OTHER): Payer: Medicare Other | Admitting: Podiatry

## 2015-08-07 ENCOUNTER — Encounter: Payer: Self-pay | Admitting: Podiatry

## 2015-08-07 DIAGNOSIS — L600 Ingrowing nail: Secondary | ICD-10-CM | POA: Diagnosis not present

## 2015-08-07 DIAGNOSIS — M79674 Pain in right toe(s): Secondary | ICD-10-CM | POA: Diagnosis not present

## 2015-08-07 DIAGNOSIS — B351 Tinea unguium: Secondary | ICD-10-CM | POA: Diagnosis not present

## 2015-08-07 NOTE — Progress Notes (Signed)
Subjective:  81 y.o. year old female patient presents via wheel chair accompanied by her daughter to have nails trimmed.  Patient unable to move from wheel chair to treatment chair.   Objective: No new changes. Dermatologic: Thick yellow deformed nails ingrown on right and left great toe medial border.  Vascular: Pedal pulses are not palpable. No edema or erythema noted.  Orthopedic: Contracted lesser digits bilateral.  Neurologic: Unable to determine. Patient cannot respond to question.   Assessment:  Dystrophic mycotic nails x 10.  Ingrown nails both great toes.   Treatment: All mycotic nails debrided.  Return in 3 months or as needed. 

## 2015-08-07 NOTE — Patient Instructions (Signed)
Seen for hypertrophic nails and ingrown nails. All nails debrided. Return in 3 months or as needed.  

## 2015-11-06 ENCOUNTER — Ambulatory Visit: Payer: Medicare Other | Admitting: Podiatry

## 2015-11-14 ENCOUNTER — Ambulatory Visit (INDEPENDENT_AMBULATORY_CARE_PROVIDER_SITE_OTHER): Payer: Medicare Other | Admitting: Podiatry

## 2015-11-14 ENCOUNTER — Encounter: Payer: Self-pay | Admitting: Podiatry

## 2015-11-14 DIAGNOSIS — L600 Ingrowing nail: Secondary | ICD-10-CM

## 2015-11-14 DIAGNOSIS — M79606 Pain in leg, unspecified: Secondary | ICD-10-CM

## 2015-11-14 DIAGNOSIS — B351 Tinea unguium: Secondary | ICD-10-CM | POA: Diagnosis not present

## 2015-11-14 NOTE — Progress Notes (Signed)
Subjective:  82 y.o. year old female patient presents via wheel chair accompanied by her daughter to have nails trimmed.  Patient is non verbal and asleep in wheel chair.  Patients wheel chair bound.   Objective: No new changes. Dermatologic: Thick yellow deformed nails ingrown on right and left great toe medial border.  Vascular: Pedal pulses are not palpable. No edema or erythema noted.  Orthopedic: Contracted lesser digits bilateral.  Neurologic: Unable to determine. Patient cannot respond to question.   Assessment:  Dystrophic mycotic nails x 10.  Ingrown nails both great toes.   Treatment: All mycotic nails debrided.  Return in 3 months or as needed.

## 2015-11-14 NOTE — Patient Instructions (Signed)
Seen for hypertrophic nails. All nails debrided. Return in 3 months or as needed.  

## 2016-02-14 ENCOUNTER — Ambulatory Visit: Payer: Medicare Other | Admitting: Podiatry

## 2016-02-21 ENCOUNTER — Ambulatory Visit: Payer: Medicare Other | Admitting: Podiatry

## 2016-11-06 DEATH — deceased
